# Patient Record
Sex: Female | Born: 1940 | Race: Black or African American | Smoking: Never smoker
Health system: Southern US, Community
[De-identification: ages and names within clinical notes are randomized; demographics above are authoritative.]

## PROBLEM LIST (undated history)

## (undated) DIAGNOSIS — J45909 Unspecified asthma, uncomplicated: Secondary | ICD-10-CM

## (undated) DIAGNOSIS — L409 Psoriasis, unspecified: Secondary | ICD-10-CM

## (undated) DIAGNOSIS — I10 Essential (primary) hypertension: Secondary | ICD-10-CM

## (undated) DIAGNOSIS — F419 Anxiety disorder, unspecified: Secondary | ICD-10-CM

## (undated) DIAGNOSIS — F200 Paranoid schizophrenia: Secondary | ICD-10-CM

## (undated) HISTORY — PX: HERNIA REPAIR: SHX51

## (undated) HISTORY — PX: ABDOMINAL HYSTERECTOMY: SHX81

---

## 2012-07-01 ENCOUNTER — Emergency Department (HOSPITAL_COMMUNITY): Payer: Medicare Other

## 2012-07-01 ENCOUNTER — Emergency Department (HOSPITAL_COMMUNITY)
Admission: EM | Admit: 2012-07-01 | Discharge: 2012-07-01 | Disposition: A | Discharge status: Home / Self Care | Payer: Medicare Other | Attending: Emergency Medicine | Admitting: Emergency Medicine

## 2012-07-01 ENCOUNTER — Encounter (HOSPITAL_COMMUNITY): Payer: Self-pay | Admitting: *Deleted

## 2012-07-01 DIAGNOSIS — H6690 Otitis media, unspecified, unspecified ear: Secondary | ICD-10-CM | POA: Insufficient documentation

## 2012-07-01 DIAGNOSIS — M25559 Pain in unspecified hip: Secondary | ICD-10-CM

## 2012-07-01 MED ORDER — ANTIPYRINE-BENZOCAINE 5.4-1.4 % OT SOLN
3.0000 [drp] | Freq: Once | OTIC | Status: AC
  Administered 2012-07-01: 4 [drp] via OTIC
  Filled 2012-07-01: qty 10

## 2012-07-01 MED ORDER — AZITHROMYCIN 250 MG PO TABS: 250.0000 mg | ORAL_TABLET | Freq: Every day | ORAL | Status: AC

## 2012-07-01 MED ORDER — TRAMADOL HCL 50 MG PO TABS: 50.0000 mg | ORAL_TABLET | Freq: Four times a day (QID) | ORAL | Status: AC | PRN

## 2012-07-01 MED ORDER — AZITHROMYCIN 250 MG PO TABS
500.0000 mg | ORAL_TABLET | Freq: Once | ORAL | Status: AC
  Administered 2012-07-01: 500 mg via ORAL
  Filled 2012-07-01: qty 2

## 2012-07-01 NOTE — ED Notes (Signed)
Family member escorted back to patient's room; one visitor at this time.

## 2012-07-01 NOTE — ED Provider Notes (Signed)
History     CSN: [id number]  Arrival date & time 07/01/12  1704   First MD Initiated Contact with Patient 07/01/12 1957      Chief Complaint  Patient presents with  . Hip Pain  . Back Pain  . Otalgia    (Consider location/radiation/quality/duration/timing/severity/associated sxs/prior treatment) HPI Comments: Ms. Tenny Craw is a morbidly obese 71 year old African American female who was seen at [facility] on August 15 for complaints of low back pain and left hip pain she was diagnosed with arthralgias after being x-rayed given a prescription for 500 mg of Naprosyn is not helping her she's not happy with that answer she knows that there's something more serious well she does not have a primary care physician. Today she is also stating that she has intermittent sore throat first thing in the morning and right ear pain that has been persistent for the last 3 days she denies any URI symptoms but does report seasonal allergies. She does not have a primary care physician  Patient is a 71 y.o. female presenting with hip pain, back pain, and ear pain. The history is provided by the patient.  Hip Pain This is a new problem. The current episode started more than 1 month ago. The problem occurs constantly. The problem has been unchanged. Associated symptoms include arthralgias and a sore throat. Pertinent negatives include no abdominal pain, chest pain, chills, congestion, fever, joint swelling, myalgias, nausea, numbness, rash or weakness. The symptoms are aggravated by walking. She has tried NSAIDs for the symptoms. The treatment provided no relief.  Back Pain  Pertinent negatives include no chest pain, no fever, no numbness, no abdominal pain and no weakness.  Otalgia Associated symptoms include sore throat. Pertinent negatives include no ear discharge, no hearing loss, no rhinorrhea, no abdominal pain, no diarrhea and no rash.    History reviewed. No pertinent past medical history.  Past  Surgical History  Procedure Date  . Abdominal hysterectomy   . Hernia repair     History reviewed. No pertinent family history.  History  Substance Use Topics  . Smoking status: Never Smoker   . Smokeless tobacco: Not on file  . Alcohol Use: No    OB History    Grav Para Term Preterm Abortions TAB SAB Ect Mult Living                  Review of Systems  Constitutional: Negative for fever and chills.  HENT: Positive for ear pain and sore throat. Negative for hearing loss, congestion, rhinorrhea, sneezing, neck stiffness and ear discharge.   Respiratory: Negative for shortness of breath.   Cardiovascular: Negative for chest pain and leg swelling.  Gastrointestinal: Negative for nausea, abdominal pain and diarrhea.  Musculoskeletal: Positive for back pain and arthralgias. Negative for myalgias and joint swelling.  Skin: Negative for rash and wound.  Neurological: Negative for dizziness, weakness and numbness.    Allergies  Lithium and Penicillins  Home Medications   Current Outpatient Rx  Name Route Sig Dispense Refill  . ACETAMINOPHEN 500 MG PO TABS Oral Take 1,500 mg by mouth every 6 (six) hours as needed. For pain    . DIPHENHYDRAMINE HCL 25 MG PO CAPS Oral Take 100 mg by mouth every 6 (six) hours as needed. For side affects caused by haldol    . HALOPERIDOL 10 MG PO TABS Oral Take 10 mg by mouth daily.    Marland Kitchen NAPROXEN 500 MG PO TABS Oral Take 500 mg by  mouth 2 (two) times daily as needed. For pain    . TRAVOPROST (BAK FREE) 0.004 % OP SOLN Both Eyes Place 1 drop into both eyes at bedtime.    . AZITHROMYCIN 250 MG PO TABS Oral Take 1 tablet (250 mg total) by mouth daily. 4 tablet 0  . TRAMADOL HCL 50 MG PO TABS Oral Take 1 tablet (50 mg total) by mouth every 6 (six) hours as needed for pain. 15 tablet 0    BP 143/69  Pulse 73  Temp 98.3 F (36.8 C) (Oral)  Resp 22  SpO2 96%  Physical Exam  Constitutional: She is oriented to person, place, and time. She appears  well-developed and well-nourished.       Morbidly obese patient observed ambulating she has difficulty changing positions but once upright can't ambulate without assistance of cane or walker  HENT:  Head: Normocephalic.  Right Ear: Hearing and external ear normal. Tympanic membrane is injected and erythematous.  Eyes: Pupils are equal, round, and reactive to light.  Neck: Normal range of motion.  Cardiovascular: Normal rate.   Musculoskeletal: Normal range of motion. She exhibits tenderness. She exhibits no edema.       Back:  Neurological: She is alert and oriented to person, place, and time.    ED Course  Procedures (including critical care time)  Labs Reviewed - No data to display Dg Lumbar Spine Complete  07/01/2012  *RADIOLOGY REPORT*  Clinical Data: Back pain.  LUMBAR SPINE - COMPLETE 4+ VIEW  Comparison: None  Findings: There is a left convex lumbar scoliosis.  Normal alignment on the lateral film.  Disc spaces and vertebral bodies are maintained.  There are facet degenerative changes but no definite pars defects.  SI joint degenerative changes are noted.  IMPRESSION: Normal alignment and no acute bony findings. Scoliosis and facet degenerative changes.   Original Report Authenticated By: P. Loralie Champagne, M.D.    Dg Hip Complete Left  07/01/2012  *RADIOLOGY REPORT*  Clinical Data: Left hip pain.  LEFT HIP - COMPLETE 2+ VIEW  Comparison: None  Findings: There are bilateral hip joint degenerative changes much more significant on the right.  No acute fracture or plain film evidence of avascular necrosis.  Pubic symphysis and SI joints are intact. Mild degenerative changes.  No pelvic fractures.  IMPRESSION:  1.  Bilateral hip joint degenerative changes, right greater than left. 2.  No acute fracture.   Original Report Authenticated By: P. Loralie Champagne, M.D.      1. Otitis media   2. Arthralgia of hip       MDM  Since I do not have access to the x-rays that were performed in  [facility] I will rex-ray her hip and back to verify the diagnosis of arthralgias she also does have a right otitis we'll treat this with an antibiotic and referred to primary care physician's         Arman Filter, NP 07/01/12 2058

## 2012-07-01 NOTE — ED Notes (Signed)
To ED for further eval of left hip pain, lower back pain, and right ear pain. States she was seen at [facility] for same and told she has arthritis. Given script for aleve with relief for 2 hrs at a time. States she feels like something in her hip. Denies injury

## 2012-07-03 NOTE — ED Provider Notes (Signed)
Medical screening examination/treatment/procedure(s) were performed by non-physician practitioner and as supervising physician I was immediately available for consultation/collaboration.   Richardean Canal, MD 07/03/12 [phone]

## 2014-09-11 ENCOUNTER — Encounter (HOSPITAL_BASED_OUTPATIENT_CLINIC_OR_DEPARTMENT_OTHER): Payer: Self-pay | Admitting: *Deleted

## 2014-09-11 ENCOUNTER — Emergency Department (HOSPITAL_BASED_OUTPATIENT_CLINIC_OR_DEPARTMENT_OTHER)
Admission: EM | Admit: 2014-09-11 | Discharge: 2014-09-11 | Disposition: A | Discharge status: Home / Self Care | Payer: Medicare HMO | Attending: Emergency Medicine | Admitting: Emergency Medicine

## 2014-09-11 DIAGNOSIS — R000 Tachycardia, unspecified: Secondary | ICD-10-CM | POA: Insufficient documentation

## 2014-09-11 DIAGNOSIS — B379 Candidiasis, unspecified: Secondary | ICD-10-CM

## 2014-09-11 DIAGNOSIS — E669 Obesity, unspecified: Secondary | ICD-10-CM | POA: Insufficient documentation

## 2014-09-11 DIAGNOSIS — Z90710 Acquired absence of both cervix and uterus: Secondary | ICD-10-CM | POA: Insufficient documentation

## 2014-09-11 DIAGNOSIS — F200 Paranoid schizophrenia: Secondary | ICD-10-CM | POA: Insufficient documentation

## 2014-09-11 DIAGNOSIS — L293 Anogenital pruritus, unspecified: Secondary | ICD-10-CM | POA: Diagnosis present

## 2014-09-11 DIAGNOSIS — B373 Candidiasis of vulva and vagina: Secondary | ICD-10-CM | POA: Diagnosis not present

## 2014-09-11 DIAGNOSIS — Z9889 Other specified postprocedural states: Secondary | ICD-10-CM | POA: Diagnosis not present

## 2014-09-11 DIAGNOSIS — Z880 Allergy status to penicillin: Secondary | ICD-10-CM | POA: Insufficient documentation

## 2014-09-11 DIAGNOSIS — Z79899 Other long term (current) drug therapy: Secondary | ICD-10-CM | POA: Insufficient documentation

## 2014-09-11 DIAGNOSIS — F419 Anxiety disorder, unspecified: Secondary | ICD-10-CM | POA: Diagnosis not present

## 2014-09-11 HISTORY — DX: Paranoid schizophrenia: F20.0

## 2014-09-11 HISTORY — DX: Anxiety disorder, unspecified: F41.9

## 2014-09-11 HISTORY — DX: Psoriasis, unspecified: L40.9

## 2014-09-11 LAB — URINALYSIS, ROUTINE W REFLEX MICROSCOPIC
Bilirubin Urine: NEGATIVE
GLUCOSE, UA: NEGATIVE mg/dL
HGB URINE DIPSTICK: NEGATIVE
KETONES UR: NEGATIVE mg/dL
Nitrite: NEGATIVE
PROTEIN: 30 mg/dL — AB
Specific Gravity, Urine: 1.011 (ref 1.005–1.030)
UROBILINOGEN UA: 0.2 mg/dL (ref 0.0–1.0)
pH: 5 (ref 5.0–8.0)

## 2014-09-11 LAB — URINE MICROSCOPIC-ADD ON

## 2014-09-11 LAB — WET PREP, GENITAL
Trich, Wet Prep: NONE SEEN
YEAST WET PREP: NONE SEEN

## 2014-09-11 MED ORDER — NYSTATIN-TRIAMCINOLONE 100000-0.1 UNIT/GM-% EX CREA: TOPICAL_CREAM | CUTANEOUS | Status: DC

## 2014-09-11 MED ORDER — FLUCONAZOLE 50 MG PO TABS
150.0000 mg | ORAL_TABLET | Freq: Once | ORAL | Status: AC
  Administered 2014-09-11: 150 mg via ORAL
  Filled 2014-09-11 (×2): qty 1

## 2014-09-11 MED ORDER — FLUCONAZOLE 150 MG PO TABS: 150.0000 mg | ORAL_TABLET | Freq: Every day | ORAL | Status: DC

## 2014-09-11 NOTE — ED Notes (Signed)
MD at bedside. 

## 2014-09-11 NOTE — ED Notes (Signed)
Vaginal itching and green discharge.

## 2014-09-11 NOTE — Discharge Instructions (Signed)
Take the medication and use the cream as directed.  Follow up with your doctor. Return here as needed.

## 2014-09-11 NOTE — ED Provider Notes (Signed)
CSN: [id number]     Arrival date & time 09/11/14  1657 History   First MD Initiated Contact with Patient 09/11/14 1900     Chief Complaint  Patient presents with  . Vaginal Itching     (Consider location/radiation/quality/duration/timing/severity/associated sxs/prior Treatment) Patient is a 73 y.o. female presenting with vaginal itching.  Vaginal Itching This is a new problem. The current episode started 1 to 4 weeks ago. The problem occurs constantly. The problem has been gradually worsening.   Suzanne Reyes is a 73 y.o. female who presents to the ED with her daughter. The patient states she has had vaginal discharge x 2 weeks. She describes the discharge as thick green. She complains of vaginal itching and burning and itching and burning to the inner aspect of both thighs.  She has used OTC yeast cream without results. The patient's daughter is afraid the patient has diabetes. Patient has had a hysterectomy.   Past Medical History  Diagnosis Date  . Paranoid schizophrenia   . Psoriasis   . Anxiety    Past Surgical History  Procedure Laterality Date  . Abdominal hysterectomy    . Hernia repair     No family history on file. History  Substance Use Topics  . Smoking status: Never Smoker   . Smokeless tobacco: Not on file  . Alcohol Use: No   OB History    No data available     Review of Systems Negative except as stated in HPI   Allergies  Lithium and Penicillins  Home Medications   Prior to Admission medications   Medication Sig Start Date End Date Taking? Authorizing Provider  acetaminophen (TYLENOL) 500 MG tablet Take 1,500 mg by mouth every 6 (six) hours as needed. For pain    Historical Provider, MD  diphenhydrAMINE (BENADRYL) 25 mg capsule Take 100 mg by mouth every 6 (six) hours as needed. For side affects caused by haldol    Historical Provider, MD  haloperidol (HALDOL) 10 MG tablet Take 10 mg by mouth daily.    Historical Provider, MD  naproxen (NAPROSYN)  500 MG tablet Take 500 mg by mouth 2 (two) times daily as needed. For pain    Historical Provider, MD  Travoprost, BAK Free, (TRAVATAN) 0.004 % SOLN ophthalmic solution Place 1 drop into both eyes at bedtime.    Historical Provider, MD   BP 122/56 mmHg  Pulse 94  Temp(Src) 98 F (36.7 C) (Oral)  Resp 18  Ht 5\' 3"  (1.6 m)  Wt 238 lb (107.956 kg)  BMI 42.17 kg/m2  SpO2 96% Physical Exam  Constitutional: She is oriented to person, place, and time. No distress.  obese  HENT:  Head: Normocephalic.  Eyes: EOM are normal.  Neck: Normal range of motion. Neck supple.  Cardiovascular: Tachycardia present.   Pulmonary/Chest: Effort normal.  Abdominal: Soft. There is no tenderness.  Genitourinary:  There is redness and peeling of the skin of the inner aspect of both thighs. There is erythema of the vagina with thick white discharge in the vaginal vault. Cervix and uterus absent.   Musculoskeletal: Normal range of motion.  Neurological: She is alert and oriented to person, place, and time. No cranial nerve deficit.  Skin: Skin is warm and dry.  Psychiatric: She has a normal mood and affect. Her behavior is normal.  Nursing note and vitals reviewed.   ED Course  Procedures  Results for orders placed or performed during the hospital encounter of 09/11/14 (from the past 24 hour(s))  Wet prep, genital     Status: Abnormal   Collection Time: 09/11/14  7:24 PM  Result Value Ref Range   Yeast Wet Prep HPF POC NONE SEEN NONE SEEN   Trich, Wet Prep NONE SEEN NONE SEEN   Clue Cells Wet Prep HPF POC FEW (A) NONE SEEN   WBC, Wet Prep HPF POC FEW (A) NONE SEEN  Urinalysis, Routine w reflex microscopic     Status: Abnormal   Collection Time: 09/11/14  8:46 PM  Result Value Ref Range   Color, Urine YELLOW YELLOW   APPearance CLOUDY (A) CLEAR   Specific Gravity, Urine 1.011 1.005 - 1.030   pH 5.0 5.0 - 8.0   Glucose, UA NEGATIVE NEGATIVE mg/dL   Hgb urine dipstick NEGATIVE NEGATIVE   Bilirubin  Urine NEGATIVE NEGATIVE   Ketones, ur NEGATIVE NEGATIVE mg/dL   Protein, ur 30 (A) NEGATIVE mg/dL   Urobilinogen, UA 0.2 0.0 - 1.0 mg/dL   Nitrite NEGATIVE NEGATIVE   Leukocytes, UA SMALL (A) NEGATIVE  Urine microscopic-add on     Status: Abnormal   Collection Time: 09/11/14  8:46 PM  Result Value Ref Range   Squamous Epithelial / LPF FEW (A) RARE   WBC, UA 3-6 <3 WBC/hpf   Bacteria, UA FEW (A) RARE   Casts HYALINE CASTS (A) NEGATIVE    MDM  73 y.o. female with vaginal itching and discharge. Will treat for monilia. Diflucan 150 mg PO given prior to d/c. Discussed with the patient and her family member clinical and lab findings and all questioned fully answered. She will follow up with her PCP or return here as needed. Stable for discharge without fever, UTI symptoms or abdominal pain.    Medication List    TAKE these medications        fluconazole 150 MG tablet  Commonly known as:  DIFLUCAN  Take 1 tablet (150 mg total) by mouth daily.     nystatin-triamcinolone cream  Commonly known as:  MYCOLOG II  Apply to affected area daily      ASK your doctor about these medications        acetaminophen 500 MG tablet  Commonly known as:  TYLENOL  Take 1,500 mg by mouth every 6 (six) hours as needed. For pain     diphenhydrAMINE 25 mg capsule  Commonly known as:  BENADRYL  Take 100 mg by mouth every 6 (six) hours as needed. For side affects caused by haldol     haloperidol 10 MG tablet  Commonly known as:  HALDOL  Take 10 mg by mouth daily.     naproxen 500 MG tablet  Commonly known as:  NAPROSYN  Take 500 mg by mouth 2 (two) times daily as needed. For pain     Travoprost (BAK Free) 0.004 % Soln ophthalmic solution  Commonly known as:  TRAVATAN  Place 1 drop into both eyes at bedtime.          GoldonnaHope M Neese, NP 09/12/14 2141  Richardean Canalavid H Yao, MD 09/12/14 2325

## 2014-09-27 ENCOUNTER — Encounter (HOSPITAL_BASED_OUTPATIENT_CLINIC_OR_DEPARTMENT_OTHER): Payer: Self-pay | Admitting: *Deleted

## 2014-09-27 ENCOUNTER — Emergency Department (HOSPITAL_BASED_OUTPATIENT_CLINIC_OR_DEPARTMENT_OTHER)
Admission: EM | Admit: 2014-09-27 | Discharge: 2014-09-27 | Disposition: A | Discharge status: Home / Self Care | Payer: Medicare HMO | Attending: Emergency Medicine | Admitting: Emergency Medicine

## 2014-09-27 DIAGNOSIS — F200 Paranoid schizophrenia: Secondary | ICD-10-CM | POA: Insufficient documentation

## 2014-09-27 DIAGNOSIS — Z9889 Other specified postprocedural states: Secondary | ICD-10-CM | POA: Insufficient documentation

## 2014-09-27 DIAGNOSIS — Z90710 Acquired absence of both cervix and uterus: Secondary | ICD-10-CM | POA: Insufficient documentation

## 2014-09-27 DIAGNOSIS — N760 Acute vaginitis: Secondary | ICD-10-CM | POA: Diagnosis not present

## 2014-09-27 DIAGNOSIS — Z8619 Personal history of other infectious and parasitic diseases: Secondary | ICD-10-CM | POA: Diagnosis not present

## 2014-09-27 DIAGNOSIS — Z872 Personal history of diseases of the skin and subcutaneous tissue: Secondary | ICD-10-CM | POA: Diagnosis not present

## 2014-09-27 DIAGNOSIS — Z79899 Other long term (current) drug therapy: Secondary | ICD-10-CM | POA: Insufficient documentation

## 2014-09-27 DIAGNOSIS — F419 Anxiety disorder, unspecified: Secondary | ICD-10-CM | POA: Insufficient documentation

## 2014-09-27 DIAGNOSIS — N898 Other specified noninflammatory disorders of vagina: Secondary | ICD-10-CM | POA: Diagnosis present

## 2014-09-27 DIAGNOSIS — B9689 Other specified bacterial agents as the cause of diseases classified elsewhere: Secondary | ICD-10-CM

## 2014-09-27 DIAGNOSIS — Z880 Allergy status to penicillin: Secondary | ICD-10-CM | POA: Diagnosis not present

## 2014-09-27 LAB — URINALYSIS, ROUTINE W REFLEX MICROSCOPIC
BILIRUBIN URINE: NEGATIVE
Glucose, UA: NEGATIVE mg/dL
KETONES UR: NEGATIVE mg/dL
NITRITE: NEGATIVE
PROTEIN: 100 mg/dL — AB
Specific Gravity, Urine: 1.013 (ref 1.005–1.030)
UROBILINOGEN UA: 0.2 mg/dL (ref 0.0–1.0)
pH: 5 (ref 5.0–8.0)

## 2014-09-27 LAB — WET PREP, GENITAL
Trich, Wet Prep: NONE SEEN
Yeast Wet Prep HPF POC: NONE SEEN

## 2014-09-27 LAB — URINE MICROSCOPIC-ADD ON

## 2014-09-27 LAB — CBG MONITORING, ED: GLUCOSE-CAPILLARY: 104 mg/dL — AB (ref 70–99)

## 2014-09-27 MED ORDER — METRONIDAZOLE 500 MG PO TABS: 500.0000 mg | ORAL_TABLET | Freq: Two times a day (BID) | ORAL | Status: DC

## 2014-09-27 NOTE — ED Notes (Signed)
Reports itching and vaginal d/c x 1 week

## 2014-09-27 NOTE — ED Provider Notes (Signed)
CSN: [id number]     Arrival date & time 09/27/14  1942 History   This chart was scribed for Toy BakerAnthony T Terrina Docter, MD by Modena JanskyAlbert Thayil, ED Scribe. This patient was seen in room MH06/MH06 and the patient's care was started at 8:34 PM.  Chief Complaint  Patient presents with  . Vaginal Discharge   The history is provided by the patient. No language interpreter was used.    HPI Comments: Suzanne Reyes is a 73 y.o. female who presents to the Emergency Department complaining of vaginal discharge that started a week ago. She reports that she has been having a yeast infection. She states that she has associated vaginal itching and mild abdominal pain. She states that she was given pills and cream for treatment. She reports that the medication helped at first, but then her infection got worse later on. She denies any fever, emesis, or dysuria.   Past Medical History  Diagnosis Date  . Paranoid schizophrenia   . Psoriasis   . Anxiety    Past Surgical History  Procedure Laterality Date  . Abdominal hysterectomy    . Hernia repair     No family history on file. History  Substance Use Topics  . Smoking status: Never Smoker   . Smokeless tobacco: Not on file  . Alcohol Use: No   OB History    No data available     Review of Systems  Gastrointestinal: Positive for abdominal pain.  Genitourinary: Positive for vaginal discharge. Negative for dysuria.  Psychiatric/Behavioral: Negative for agitation.  All other systems reviewed and are negative.   Allergies  Lithium and Penicillins  Home Medications   Prior to Admission medications   Medication Sig Start Date End Date Taking? Authorizing Provider  acetaminophen (TYLENOL) 500 MG tablet Take 1,500 mg by mouth every 6 (six) hours as needed. For pain   Yes Historical Provider, MD  diphenhydrAMINE (BENADRYL) 25 mg capsule Take 100 mg by mouth every 6 (six) hours as needed. For side affects caused by haldol   Yes Historical Provider, MD   haloperidol (HALDOL) 10 MG tablet Take 10 mg by mouth daily.   Yes Historical Provider, MD  fluconazole (DIFLUCAN) 150 MG tablet Take 1 tablet (150 mg total) by mouth daily. 09/11/14   Hope Orlene OchM Neese, NP  naproxen (NAPROSYN) 500 MG tablet Take 500 mg by mouth 2 (two) times daily as needed. For pain    Historical Provider, MD  nystatin-triamcinolone (MYCOLOG II) cream Apply to affected area daily 09/11/14   [facility] Neese, NP  Travoprost, BAK Free, (TRAVATAN) 0.004 % SOLN ophthalmic solution Place 1 drop into both eyes at bedtime.    Historical Provider, MD   BP 147/59 mmHg  Pulse 82  Temp(Src) 98.2 F (36.8 C) (Oral)  Resp 20  Ht 5\' 3"  (1.6 m)  Wt 238 lb (107.956 kg)  BMI 42.17 kg/m2  SpO2 96% Physical Exam  Constitutional: She is oriented to person, place, and time. She appears well-developed and well-nourished.  Non-toxic appearance. No distress.  HENT:  Head: Normocephalic and atraumatic.  Eyes: Conjunctivae, EOM and lids are normal. Pupils are equal, round, and reactive to light.  Neck: Normal range of motion. Neck supple. No tracheal deviation present. No thyroid mass present.  Cardiovascular: Normal rate, regular rhythm and normal heart sounds.  Exam reveals no gallop.   No murmur heard. Pulmonary/Chest: Effort normal and breath sounds normal. No stridor. No respiratory distress. She has no decreased breath sounds. She has no wheezes.  She has no rhonchi. She has no rales.  Abdominal: Soft. Normal appearance and bowel sounds are normal. She exhibits no distension. There is no tenderness. There is no rebound and no CVA tenderness.  Musculoskeletal: Normal range of motion. She exhibits no edema or tenderness.  Neurological: She is alert and oriented to person, place, and time. She has normal strength. No cranial nerve deficit or sensory deficit. GCS eye subscore is 4. GCS verbal subscore is 5. GCS motor subscore is 6.  Skin: Skin is warm and dry. No abrasion and no rash noted.   Psychiatric: She has a normal mood and affect. Her speech is normal and behavior is normal.  Nursing note and vitals reviewed.   ED Course  Procedures (including critical care time) DIAGNOSTIC STUDIES: Oxygen Saturation is 96% on RA, normal by my interpretation.    COORDINATION OF CARE: 8:38 PM- Pt advised of plan for treatment which includes labs and pt agrees.  Labs Review Labs Reviewed  WET PREP, GENITAL - Abnormal; Notable for the following:    Clue Cells Wet Prep HPF POC FEW (*)    WBC, Wet Prep HPF POC MODERATE (*)    All other components within normal limits  CBG MONITORING, ED - Abnormal; Notable for the following:    Glucose-Capillary 104 (*)    All other components within normal limits  GC/CHLAMYDIA PROBE AMP  URINALYSIS, ROUTINE W REFLEX MICROSCOPIC    Imaging Review No results found.   EKG Interpretation None      MDM   Final diagnoses:  None    I personally performed the services described in this documentation, which was scribed in my presence. The recorded information has been reviewed and is accurate.   10:28 PM Patient to be treated for bacterial vaginosis  Toy BakerAnthony T Jamison Soward, MD 09/27/14 2228

## 2014-09-27 NOTE — Discharge Instructions (Signed)
Follow-up with your Dr. next week °

## 2014-09-30 LAB — GC/CHLAMYDIA PROBE AMP
CT Probe RNA: NEGATIVE
GC PROBE AMP APTIMA: NEGATIVE

## 2015-01-23 ENCOUNTER — Emergency Department (HOSPITAL_BASED_OUTPATIENT_CLINIC_OR_DEPARTMENT_OTHER)
Admission: EM | Admit: 2015-01-23 | Discharge: 2015-01-23 | Disposition: A | Discharge status: Home / Self Care | Payer: Medicare HMO | Attending: Emergency Medicine | Admitting: Emergency Medicine

## 2015-01-23 ENCOUNTER — Encounter (HOSPITAL_BASED_OUTPATIENT_CLINIC_OR_DEPARTMENT_OTHER): Payer: Self-pay

## 2015-01-23 DIAGNOSIS — Z8659 Personal history of other mental and behavioral disorders: Secondary | ICD-10-CM | POA: Insufficient documentation

## 2015-01-23 DIAGNOSIS — Z79899 Other long term (current) drug therapy: Secondary | ICD-10-CM | POA: Diagnosis not present

## 2015-01-23 DIAGNOSIS — L409 Psoriasis, unspecified: Secondary | ICD-10-CM | POA: Diagnosis not present

## 2015-01-23 DIAGNOSIS — Z792 Long term (current) use of antibiotics: Secondary | ICD-10-CM | POA: Insufficient documentation

## 2015-01-23 DIAGNOSIS — B3731 Acute candidiasis of vulva and vagina: Secondary | ICD-10-CM

## 2015-01-23 DIAGNOSIS — R21 Rash and other nonspecific skin eruption: Secondary | ICD-10-CM | POA: Diagnosis present

## 2015-01-23 DIAGNOSIS — I10 Essential (primary) hypertension: Secondary | ICD-10-CM | POA: Insufficient documentation

## 2015-01-23 DIAGNOSIS — Z880 Allergy status to penicillin: Secondary | ICD-10-CM | POA: Diagnosis not present

## 2015-01-23 DIAGNOSIS — J45909 Unspecified asthma, uncomplicated: Secondary | ICD-10-CM | POA: Insufficient documentation

## 2015-01-23 DIAGNOSIS — B373 Candidiasis of vulva and vagina: Secondary | ICD-10-CM | POA: Diagnosis not present

## 2015-01-23 HISTORY — DX: Unspecified asthma, uncomplicated: J45.909

## 2015-01-23 HISTORY — DX: Essential (primary) hypertension: I10

## 2015-01-23 MED ORDER — FLUCONAZOLE 50 MG PO TABS
150.0000 mg | ORAL_TABLET | Freq: Once | ORAL | Status: AC
  Administered 2015-01-23: 150 mg via ORAL
  Filled 2015-01-23 (×2): qty 1

## 2015-01-23 MED ORDER — HYDROCORTISONE 0.5 % EX CREA: 1.0000 "application " | TOPICAL_CREAM | Freq: Two times a day (BID) | CUTANEOUS | Status: DC

## 2015-01-23 NOTE — ED Provider Notes (Signed)
CSN: [id number]     Arrival date & time 01/23/15  1829 History   First MD Initiated Contact with Patient 01/23/15 1928     Chief Complaint  Patient presents with  . Rash     (Consider location/radiation/quality/duration/timing/severity/associated sxs/prior Treatment) HPI 74 year old-year-old female history of psoriasis persists a St. her psoriasis has worsened over the past month. She has more itching. The rash has been about the same. She is also noted some rash in the inner thighs and vaginal area with some yellowish discharge from this area. She is not sexually active. She is not having any pain with urination. She has not been on any recent antibiotics. She is not a diabetic. Past Medical History  Diagnosis Date  . Paranoid schizophrenia   . Psoriasis   . Anxiety   . Hypertension   . Asthma    Past Surgical History  Procedure Laterality Date  . Abdominal hysterectomy    . Hernia repair     History reviewed. No pertinent family history. History  Substance Use Topics  . Smoking status: Never Smoker   . Smokeless tobacco: Not on file  . Alcohol Use: No   OB History    No data available     Review of Systems  All other systems reviewed and are negative.     Allergies  Lithium and Penicillins  Home Medications   Prior to Admission medications   Medication Sig Start Date End Date Taking? Authorizing Provider  acetaminophen (TYLENOL) 500 MG tablet Take 1,500 mg by mouth every 6 (six) hours as needed. For pain   Yes Historical Provider, MD  diphenhydrAMINE (BENADRYL) 25 mg capsule Take 100 mg by mouth every 6 (six) hours as needed. For side affects caused by haldol   Yes Historical Provider, MD  haloperidol (HALDOL) 10 MG tablet Take 20 mg by mouth daily.    Yes Historical Provider, MD  naproxen (NAPROSYN) 500 MG tablet Take 500 mg by mouth 2 (two) times daily as needed. For pain   Yes Historical Provider, MD  fluconazole (DIFLUCAN) 150 MG tablet Take 1 tablet (150 mg  total) by mouth daily. 09/11/14   Hope Orlene Och, NP  metroNIDAZOLE (FLAGYL) 500 MG tablet Take 1 tablet (500 mg total) by mouth 2 (two) times daily. 09/27/14   Lorre Nick, MD  nystatin-triamcinolone [facility] II) cream Apply to affected area daily 09/11/14   [facility], NP  Travoprost, BAK Free, (TRAVATAN) 0.004 % SOLN ophthalmic solution Place 1 drop into both eyes at bedtime.    Historical Provider, MD   BP 147/50 mmHg  Pulse 72  Temp(Src) 97.6 F (36.4 C) (Oral)  Resp 18  Ht  (1.6 m)  Wt 239 lb (108.41 kg)  BMI 42.35 kg/m2  SpO2 95% Physical Exam  Constitutional: She appears well-developed and well-nourished.  HENT:  Head: Normocephalic and atraumatic.  Right Ear: External ear normal.  Nose: Nose normal.  Mouth/Throat: Oropharynx is clear and moist.  Eyes: Conjunctivae are normal. Pupils are equal, round, and reactive to light.  Neck: Normal range of motion.  Cardiovascular: Normal rate and normal heart sounds.   Pulmonary/Chest: Effort normal.  Genitourinary:     Erythematous inflamed area consistent with Candida.  Musculoskeletal: Normal range of motion.  Neurological: She is alert.  Skin: Skin is warm. Rash noted.  Diffuse maculopapular rash on trunk and forearms.  Nursing note and vitals reviewed.   ED Course  Procedures (including critical care time) Labs Review Labs Reviewed -  No data to display  Imaging Review No results found.   EKG Interpretation None      MDM   Final diagnoses:  Candidal vulvovaginitis  Psoriasis        Margarita Grizzleanielle Isaul Landi, MD 01/23/15 2324

## 2015-01-23 NOTE — Discharge Instructions (Signed)
Psoriasis Psoriasis is a long-lasting (chronic) skin problem. It can cause red bumps, a rash, scales that flake off, bleeding, and joint pain (arthritis). Psoriasis often affects the elbows, knees, groin, genitals, arms, legs, scalp, and nails. Psoriasis cannot be passed from person to person (not contagious).  HOME CARE  Only take medicine as told by your doctor.  Keep all doctor visits as told. You may need to try many treatments to find what works for you.  Avoid sunburn, cuts, scrapes, alcohol, smoking, and stress.  Wear gloves when you wash dishes, clean, or go outside in the cold.  Keep the air moist and cool in your home. You can use a humidifier.  Put lotion on right after a bath or shower.  Avoid long, hot baths and showers. Do not use a lot of soap.  Drink enough fluids to keep your pee (urine) clear or pale yellow. GET HELP RIGHT AWAY IF:   You have pain in the affected areas.  You have bleeding that does not stop in the affected areas.  You have more redness or warmth in the affected areas.  You have painful or stiff joints.  You feel depressed.  You have a fever. MAKE SURE YOU:  Understand these instructions.  Will watch your condition.  Will get help right away if you are not doing well or get worse. Document Released: 12/01/2004 Document Revised: 01/16/2012 Document Reviewed: 04/22/2011 [facility] Patient Information 2015 [place], [place]. This information is not intended to replace advice given to you by your health care provider. Make sure you discuss any questions you have with your health care provider.  Candida Infection A Candida infection (also called yeast, fungus, and Monilia infection) is an overgrowth of yeast that can occur anywhere on the body. A yeast infection commonly occurs in warm, moist body areas. Usually, the infection remains localized but can spread to become a systemic infection. A yeast infection may be a sign of a more severe disease  such as diabetes, leukemia, or AIDS. A yeast infection can occur in both men and women. In women, Candida vaginitis is a vaginal infection. It is one of the most common causes of vaginitis. Men usually do not have symptoms or know they have an infection until other problems develop. Men may find out they have a yeast infection because their sex partner has a yeast infection. Uncircumcised men are more likely to get a yeast infection than circumcised men. This is because the uncircumcised glans is not exposed to air and does not remain as dry as that of a circumcised glans. Older adults may develop yeast infections around dentures. CAUSES  Women  Antibiotics.  Steroid medication taken for a long time.  Being overweight (obese).  Diabetes.  Poor immune condition.  Certain serious medical conditions.  Immune suppressive medications for organ transplant patients.  Chemotherapy.  Pregnancy.  Menstruation.  Stress and fatigue.  Intravenous drug use.  Oral contraceptives.  Wearing tight-fitting clothes in the crotch area.  Catching it from a sex partner who has a yeast infection.  Spermicide.  Intravenous, urinary, or other catheters. Men  Catching it from a sex partner who has a yeast infection.  Having oral or anal sex with a person who has the infection.  Spermicide.  Diabetes.  Antibiotics.  Poor immune system.  Medications that suppress the immune system.  Intravenous drug use.  Intravenous, urinary, or other catheters. SYMPTOMS  Women  Thick, white vaginal discharge.  Vaginal itching.  Redness and swelling in  and around the vagina.  Irritation of the lips of the vagina and perineum.  Blisters on the vaginal lips and perineum.  Painful sexual intercourse.  Low blood sugar (hypoglycemia).  Painful urination.  Bladder infections.  Intestinal problems such as constipation, indigestion, bad breath, bloating, increase in gas, diarrhea, or loose  stools. Men  Men may develop intestinal problems such as constipation, indigestion, bad breath, bloating, increase in gas, diarrhea, or loose stools.  Dry, cracked skin on the penis with itching or discomfort.  Jock itch.  Dry, flaky skin.  Athlete's foot.  Hypoglycemia. DIAGNOSIS  Women  A history and an exam are performed.  The discharge may be examined under a microscope.  A culture may be taken of the discharge. Men  A history and an exam are performed.  Any discharge from the penis or areas of cracked skin will be looked at under the microscope and cultured.  Stool samples may be cultured. TREATMENT  Women  Vaginal antifungal suppositories and creams.  Medicated creams to decrease irritation and itching on the outside of the vagina.  Warm compresses to the perineal area to decrease swelling and discomfort.  Oral antifungal medications.  Medicated vaginal suppositories or cream for repeated or recurrent infections.  Wash and dry the irritation areas before applying the cream.  Eating yogurt with Lactobacillus may help with prevention and treatment.  Sometimes painting the vagina with gentian violet solution may help if creams and suppositories do not work. Men  Antifungal creams and oral antifungal medications.  Sometimes treatment must continue for 30 days after the symptoms go away to prevent recurrence. HOME CARE INSTRUCTIONS  Women  Use cotton underwear and avoid tight-fitting clothing.  Avoid colored, scented toilet paper and deodorant tampons or pads.  Do not douche.  Keep your diabetes under control.  Finish all the prescribed medications.  Keep your skin clean and dry.  Consume milk or yogurt with Lactobacillus-active culture regularly. If you get frequent yeast infections and think that is what the infection is, there are over-the-counter medications that you can get. If the infection does not show healing in 3 days, talk to your  caregiver.  Tell your sex partner you have a yeast infection. Your partner may need treatment also, especially if your infection does not clear up or recurs. Men  Keep your skin clean and dry.  Keep your diabetes under control.  Finish all prescribed medications.  Tell your sex partner that you have a yeast infection so he or she can be treated if necessary. SEEK MEDICAL CARE IF:   Your symptoms do not clear up or worsen in one week after treatment.  You have an oral temperature above 102 F (38.9 C).  You have trouble swallowing or eating for a prolonged time.  You develop blisters on and around your vagina.  You develop vaginal bleeding and it is not your menstrual period.  You develop abdominal pain.  You develop intestinal problems as mentioned above.  You get weak or light-headed.  You have painful or increased urination.  You have pain during sexual intercourse. MAKE SURE YOU:   Understand these instructions.  Will watch your condition.  Will get help right away if you are not doing well or get worse. Document Released: 12/01/2004 Document Revised: 03/10/2014 Document Reviewed: 03/15/2010 [facility] Patient Information 2015 [place], [place]. This information is not intended to replace advice given to you by your health care provider. Make sure you discuss any questions you have with your health  care provider.  

## 2015-01-23 NOTE — ED Notes (Signed)
Pt reports generalized body rash that itches, states history of psoriasis - pt also reports she has a feminine rash x2 days - states she has used douches, vaginal creams that "burn."

## 2015-02-10 ENCOUNTER — Encounter: Payer: Self-pay | Admitting: Family

## 2015-02-10 ENCOUNTER — Ambulatory Visit (INDEPENDENT_AMBULATORY_CARE_PROVIDER_SITE_OTHER): Payer: Medicare HMO | Admitting: Family

## 2015-02-10 VITALS — BP 118/68 | HR 86 | Temp 97.8°F | Resp 16 | Ht 61.5 in | Wt 234.0 lb

## 2015-02-10 DIAGNOSIS — F200 Paranoid schizophrenia: Secondary | ICD-10-CM | POA: Insufficient documentation

## 2015-02-10 DIAGNOSIS — F411 Generalized anxiety disorder: Secondary | ICD-10-CM

## 2015-02-10 DIAGNOSIS — J4520 Mild intermittent asthma, uncomplicated: Secondary | ICD-10-CM | POA: Diagnosis not present

## 2015-02-10 DIAGNOSIS — L409 Psoriasis, unspecified: Secondary | ICD-10-CM | POA: Diagnosis not present

## 2015-02-10 DIAGNOSIS — I10 Essential (primary) hypertension: Secondary | ICD-10-CM | POA: Diagnosis not present

## 2015-02-10 DIAGNOSIS — J45909 Unspecified asthma, uncomplicated: Secondary | ICD-10-CM | POA: Insufficient documentation

## 2015-02-10 MED ORDER — BETAMETHASONE DIPROPIONATE 0.05 % EX CREA: TOPICAL_CREAM | Freq: Two times a day (BID) | CUTANEOUS | Status: DC

## 2015-02-10 NOTE — Assessment & Plan Note (Signed)
Stable on haldol, [facility]

## 2015-02-10 NOTE — Assessment & Plan Note (Signed)
Stable, no recent symptoms.  

## 2015-02-10 NOTE — Progress Notes (Signed)
Subjective:    Patient ID: Suzanne Reyes, female    DOB: 10/09/1941, 74 y.o.   MRN: [id number]  HPI  Ms.  Mckissack is a 74 yr old female who presents today to establish care.   Hx Paranoid Schizophrenia- She sees Psychiatry-  At [facility].  She reports well controlled without hallucinations on the haldol.   Psoriasis-  Sees Dr. Katrinka [facility]- [facility]. No improvement with hydrocortisone    Anxiety- reports well controlled.    HTN- Not currently on meds.  BP Readings from Last 3 Encounters:  02/10/15 118/68  01/23/15 147/89  09/27/14 138/67   Asthma- denies any recent symptoms, never needs to use an inhaler.    Review of Systems  Constitutional:       She reports that she has lost about 25 pounds in the last 2 years   HENT: Negative for hearing loss and rhinorrhea.   Eyes: Negative for visual disturbance.  Respiratory: Negative for cough.   Cardiovascular: Negative for leg swelling.  Gastrointestinal: Negative for nausea, diarrhea and constipation.  Genitourinary: Negative for dysuria.       Frequency due to water intake, haldol makes her thirsty  Musculoskeletal: Negative for myalgias.       Reports bilateral hip pain  Skin: Positive for rash.  Neurological:       Occasional headaches  Hematological: Negative for adenopathy.  Psychiatric/Behavioral: Negative for dysphoric mood and agitation.   Past Medical History  Diagnosis Date  . Paranoid schizophrenia   . Psoriasis   . Anxiety   . Hypertension   . Asthma     History   Social History  . Marital Status: Widowed    Spouse Name: N/A  . Number of Children: N/A  . Years of Education: N/A   Occupational History  . Not on file.   Social History Main Topics  . Smoking status: Never Smoker   . Smokeless tobacco: Not on file  . Alcohol Use: No  . Drug Use: No  . Sexual Activity: Not on file   Other Topics Concern  . Not on file   Social History Narrative   Lives with grandson who is 67 years  old   Moved from [place] 40 yrs ago   Retired Agricultural engineer   Had 7 children   Enjoys cooking    Past Surgical History  Procedure Laterality Date  . Abdominal hysterectomy    . Hernia repair      Family History  Problem Relation Age of Onset  . Kidney disease Mother   . Asthma Father   . Heart disease Father     died at age 49 "bad heart"  . Cancer Sister     lung,smoker  . Diabetes Daughter   . Cancer Son     lung, died in his 29's    Allergies  Allergen Reactions  . Lithium Other (See Comments)    Stomach cramps   . Penicillins Other (See Comments)    Tooth pain    Current Outpatient Prescriptions on File Prior to Visit  Medication Sig Dispense Refill  . acetaminophen (TYLENOL) 500 MG tablet Take 1,500 mg by mouth every 6 (six) hours as needed. For pain    . diphenhydrAMINE (BENADRYL) 25 mg capsule Take 100 mg by mouth every 6 (six) hours as needed. For side affects caused by haldol    . haloperidol (HALDOL) 10 MG tablet Take 20 mg by mouth daily.     . naproxen (NAPROSYN)  500 MG tablet Take 500 mg by mouth 2 (two) times daily as needed. For pain    . Travoprost, BAK Free, (TRAVATAN) 0.004 % SOLN ophthalmic solution Place 1 drop into both eyes at bedtime.     No current facility-administered medications on file prior to visit.    BP 118/68 mmHg  Pulse 86  Temp(Src) 97.8 F (36.6 C) (Oral)  Resp 16  Ht 5' 1.5" (1.562 m)  Wt 234 lb (106.142 kg)  BMI 43.50 kg/m2  SpO2 98%        Objective:   Physical Exam  Constitutional: She is oriented to person, place, and time. She appears well-developed and well-nourished.  HENT:  Head: Normocephalic and atraumatic.  Right Ear: Tympanic membrane and ear canal normal.  Left Ear: Tympanic membrane and ear canal normal.  Mouth/Throat: No oropharyngeal exudate, posterior oropharyngeal edema or posterior oropharyngeal erythema.  Cardiovascular: Normal rate, regular rhythm and normal heart sounds.   No murmur  heard. Pulmonary/Chest: Effort normal and breath sounds normal. No respiratory distress. She has no wheezes.  Musculoskeletal: She exhibits no edema.  Neurological: She is alert and oriented to person, place, and time.  Skin: Skin is warm and dry.  Hypopigmented diffuse annular patched noted/face arms/legs  Psychiatric: She has a normal mood and affect. Her behavior is normal. Judgment and thought content normal.          Assessment & Plan:

## 2015-02-10 NOTE — Progress Notes (Signed)
Pre visit review using our clinic review tool, if applicable. No additional management support is needed unless otherwise documented below in the visit note. 

## 2015-02-10 NOTE — Patient Instructions (Signed)
Please follow up in 1 month for a medicare wellness visit.

## 2015-02-10 NOTE — Assessment & Plan Note (Signed)
Stable.  Management per psych.

## 2015-02-10 NOTE — Assessment & Plan Note (Signed)
Stable off of meds.  

## 2015-02-12 DIAGNOSIS — L409 Psoriasis, unspecified: Secondary | ICD-10-CM | POA: Insufficient documentation

## 2015-02-12 NOTE — Addendum Note (Signed)
Addended by: Sandford Craze'SULLIVAN, Amalea Ottey on: 02/12/2015 09:43 PM   Modules accepted: Level of Service

## 2015-02-12 NOTE — Assessment & Plan Note (Signed)
Advised pt to arrange follow up with her dermatologist.  In the meantime, will proved rx for betamethasone.

## 2015-03-16 ENCOUNTER — Telehealth: Payer: Self-pay | Admitting: *Deleted

## 2015-03-16 NOTE — Telephone Encounter (Signed)
Unable to reach patient at time of Pre-Visit Call.  Voicemail box is not accepting messages- states to leave # in SMS, SMS left for patient with number.

## 2015-03-17 ENCOUNTER — Ambulatory Visit (INDEPENDENT_AMBULATORY_CARE_PROVIDER_SITE_OTHER): Payer: Medicare HMO | Admitting: Family

## 2015-03-17 ENCOUNTER — Encounter: Payer: Self-pay | Admitting: Family

## 2015-03-17 VITALS — BP 120/76 | HR 83 | Temp 97.8°F | Resp 16 | Ht 61.5 in | Wt 234.8 lb

## 2015-03-17 DIAGNOSIS — E2839 Other primary ovarian failure: Secondary | ICD-10-CM

## 2015-03-17 DIAGNOSIS — Z0000 Encounter for general adult medical examination without abnormal findings: Secondary | ICD-10-CM

## 2015-03-17 DIAGNOSIS — L304 Erythema intertrigo: Secondary | ICD-10-CM | POA: Insufficient documentation

## 2015-03-17 DIAGNOSIS — Z1239 Encounter for other screening for malignant neoplasm of breast: Secondary | ICD-10-CM

## 2015-03-17 DIAGNOSIS — L409 Psoriasis, unspecified: Secondary | ICD-10-CM

## 2015-03-17 DIAGNOSIS — F411 Generalized anxiety disorder: Secondary | ICD-10-CM

## 2015-03-17 DIAGNOSIS — Z23 Encounter for immunization: Secondary | ICD-10-CM | POA: Diagnosis not present

## 2015-03-17 DIAGNOSIS — Z1231 Encounter for screening mammogram for malignant neoplasm of breast: Secondary | ICD-10-CM

## 2015-03-17 DIAGNOSIS — J4520 Mild intermittent asthma, uncomplicated: Secondary | ICD-10-CM

## 2015-03-17 DIAGNOSIS — I10 Essential (primary) hypertension: Secondary | ICD-10-CM

## 2015-03-17 DIAGNOSIS — F200 Paranoid schizophrenia: Secondary | ICD-10-CM

## 2015-03-17 LAB — BASIC METABOLIC PANEL
BUN: 22 mg/dL (ref 6–23)
CHLORIDE: 110 meq/L (ref 96–112)
CO2: 23 mEq/L (ref 19–32)
Calcium: 9 mg/dL (ref 8.4–10.5)
Creatinine, Ser: 2.13 mg/dL — ABNORMAL HIGH (ref 0.40–1.20)
GFR: 29.17 mL/min — AB (ref >60.00)
GLUCOSE: 89 mg/dL (ref 70–99)
POTASSIUM: 4.5 meq/L (ref 3.5–5.1)
Sodium: 141 mEq/L (ref 135–145)

## 2015-03-17 LAB — LIPID PANEL
CHOL/HDL RATIO: 4
Cholesterol: 177 mg/dL (ref 0–200)
HDL: 46.6 mg/dL (ref >39.00)
LDL Cholesterol: 97 mg/dL (ref 0–99)
NONHDL: 130.4
Triglycerides: 167 mg/dL — ABNORMAL HIGH (ref 0.0–149.0)
VLDL: 33.4 mg/dL (ref 0.0–40.0)

## 2015-03-17 MED ORDER — NYSTATIN 100000 UNIT/GM EX POWD: CUTANEOUS | Status: DC

## 2015-03-17 NOTE — Progress Notes (Signed)
Pre visit review using our clinic review tool, if applicable. No additional management support is needed unless otherwise documented below in the visit note. 

## 2015-03-17 NOTE — Patient Instructions (Addendum)
Contact your insurance to check on coverage for shingles vaccine.  Schedule mammogram on the first floor in imaging.  You will be contacted about scheduling your bone density.  Complete lab work prior to leaving.  Apply nystatin powder beneath both breasts daily for yeast infection.  Please schedule a visit with an eye doctor for eye check. Follow up in 6 months.

## 2015-03-17 NOTE — Assessment & Plan Note (Signed)
Stable- management per psych.  

## 2015-03-17 NOTE — Assessment & Plan Note (Signed)
Uncontrolled, trial of betamethasone- follow up with derm.

## 2015-03-17 NOTE — Assessment & Plan Note (Signed)
Stable, management per psych 

## 2015-03-17 NOTE — Assessment & Plan Note (Signed)
rx with nystatin 

## 2015-03-17 NOTE — Progress Notes (Signed)
Subjective:    Suzanne AgentShirley Reyes is a 74 y.o. female who presents for Medicare Annual/Subsequent preventive examination.  Preventive Screening-Counseling & Management  Tobacco History  Smoking status  . Never Smoker   Smokeless tobacco  . Not on file     Problems Prior to Visit 1. Schizophrenia/anxiety- maintained on daily haloperidol, [facility]   2.  Asthma- stable, declines MDI.  3.  HTN- Not on meds. BP Readings from Last 3 Encounters:  03/17/15 120/76  02/10/15 118/68  01/23/15 147/89   4. Psoriasis- followed by Dermatology.  Never picked up betamethasone- but will start.   5. Preventative-   Immunizations: due for pneumovax and prevnar and tetanus Diet: reports diet is fair.  Too much fried food.   Exercise: no  Colonoscopy: never, declines OK with cologuard.   Dexa: due   Pap Smear: s/p hysterectomy Mammogram: due    Current Problems (verified) Patient Active Problem List   Diagnosis Date Noted  . Psoriasis 02/12/2015  . Paranoid schizophrenia 02/10/2015  . Anxiety state 02/10/2015  . Asthma 02/10/2015  . HTN (hypertension) 02/10/2015    Medications Prior to Visit Current Outpatient Prescriptions on File Prior to Visit  Medication Sig Dispense Refill  . acetaminophen (TYLENOL) 500 MG tablet Take 1,500 mg by mouth every 6 (six) hours as needed. For pain    . betamethasone dipropionate (DIPROLENE) 0.05 % cream Apply topically 2 (two) times daily. Prn to affected areas 30 g 0  . diphenhydrAMINE (BENADRYL) 25 mg capsule Take 100 mg by mouth every 6 (six) hours as needed. For side affects caused by haldol    . haloperidol (HALDOL) 10 MG tablet Take 20 mg by mouth daily.     . naproxen (NAPROSYN) 500 MG tablet Take 500 mg by mouth 2 (two) times daily as needed. For pain    . Travoprost, BAK Free, (TRAVATAN) 0.004 % SOLN ophthalmic solution Place 1 drop into both eyes at bedtime.     No current facility-administered medications on  file prior to visit.    Current Medications (verified) Current Outpatient Prescriptions  Medication Sig Dispense Refill  . acetaminophen (TYLENOL) 500 MG tablet Take 1,500 mg by mouth every 6 (six) hours as needed. For pain    . betamethasone dipropionate (DIPROLENE) 0.05 % cream Apply topically 2 (two) times daily. Prn to affected areas 30 g 0  . diphenhydrAMINE (BENADRYL) 25 mg capsule Take 100 mg by mouth every 6 (six) hours as needed. For side affects caused by haldol    . haloperidol (HALDOL) 10 MG tablet Take 20 mg by mouth daily.     . naproxen (NAPROSYN) 500 MG tablet Take 500 mg by mouth 2 (two) times daily as needed. For pain    . Travoprost, BAK Free, (TRAVATAN) 0.004 % SOLN ophthalmic solution Place 1 drop into both eyes at bedtime.     No current facility-administered medications for this visit.     Allergies (verified) Lithium and Penicillins   PAST HISTORY  Family History Family History  Problem Relation Age of Onset  . Kidney disease Mother   . Asthma Father   . Heart disease Father     died at age 74 "bad heart"  . Cancer Sister     lung,smoker  . Diabetes Daughter   . Cancer Son     lung, died in his 7240's    Social History History  Substance Use Topics  . Smoking status: Never Smoker   .  Smokeless tobacco: Not on file  . Alcohol Use: No     Are there smokers in your home (other than you)? Yes- grandson  Risk Factors Current exercise habits: none  Dietary issues discussed: yes   Cardiac risk factors: advanced age (older than 6855 for men, 365 for women), hypertension, obesity (BMI >= 30 kg/m2) and sedentary lifestyle.  Depression Screen (Note: if answer to either of the following is "Yes", a more complete depression screening is indicated)   Over the past two weeks, have you felt down, depressed or hopeless? No  Over the past two weeks, have you felt little interest or pleasure in doing things? No  Have you lost interest or pleasure in daily life?  No  Do you often feel hopeless? No  Do you cry easily over simple problems? No  Activities of Daily Living In your present state of health, do you have any difficulty performing the following activities?:  Driving? Yes Managing money?  No Feeding yourself? No Getting from bed to chair? No. Climbing a flight of stairs? No Preparing food and eating?: No Bathing or showering? No Getting dressed: No Getting to the toilet? No Using the toilet:No Moving around from place to place: No In the past year have you fallen or had a near fall?:No   Are you sexually active?  No  Do you have more than one partner?  No  Hearing Difficulties: Yes Do you often ask people to speak up or repeat themselves? Yes Do you experience ringing or noises in your ears? occasionally Do you have difficulty understanding soft or whispered voices? Yes- declines referral to audiology   Do you feel that you have a problem with memory? No  Do you often misplace items? Yes- sometimes  Do you feel safe at home?  Yes  Cognitive Testing  Alert? Yes  Normal Appearance?Yes  Oriented to person? Yes  Place? Yes   Time? Yes  Recall of three objects?  Yes  Can perform simple calculations? Yes  Displays appropriate judgment?Yes  Can read the correct time from a watch face?Yes   Advanced Directives have been discussed with the patient? Yes  List the Names of Other Physician/Practitioners you currently use: 1.  Dr. Reginia NaasEdward Smith -Dermatology 2.  Psych- [facility]  Indicate any recent Medical Services you may have received from other than [facility] providers in the past year (date may be approximate).   There is no immunization history on file for this patient.  Screening Tests Health Maintenance  Topic Date Due  . TETANUS/TDAP  09/08/1960  . MAMMOGRAM  09/09/1991  . COLONOSCOPY  09/09/1991  . ZOSTAVAX  09/08/2001  . DEXA SCAN  09/08/2006  . PNA vac Low Risk Adult (1 of 2 - PCV13) 09/08/2006  . INFLUENZA VACCINE   06/08/2015    All answers were reviewed with the patient and necessary referrals were made:  Suzanne Reyes,Suzanne Boening S., NP   03/17/2015   History reviewed: allergies, current medications, past family history, past medical history, past social history, past surgical history and problem list  Review of Systems  Review of Systems  Constitutional: Negative for unexpected weight change.  HENT: Negative for and rhinorrhea.   Eyes: Negative for visual disturbance.  Respiratory: Negative for cough.   Cardiovascular: Negative for chest pain and leg swelling.  Gastrointestinal: Negative for nausea, vomiting, diarrhea and blood in stool.  Genitourinary: Negative for dysuria and frequency.  Musculoskeletal: Negative for myalgias and arthralgias.  Skin: +  rash.  Neurological: Negative for  headaches.  Hematological: Negative for adenopathy.  Psychiatric/Behavioral: Denies depression or anxiety- currently     Objective:     Body mass index is 43.65 kg/(m^2). BP 120/76 mmHg  Pulse 83  Temp(Src) 97.8 F (36.6 C) (Oral)  Resp 16  Ht 5' 1.5" (1.562 m)  Wt 234 lb 12.8 oz (106.505 kg)  BMI 43.65 kg/m2  SpO2 99%  Physical Exam  Constitutional: She is oriented to person, place, and time. She appears well-developed and well-nourished. No distress.  HENT:  Head: Normocephalic and atraumatic.  Right Ear: Tympanic membrane and ear canal normal.  Left Ear: Tympanic membrane and ear canal normal.  Mouth/Throat: Oropharynx is clear and moist.  Eyes: Pupils are equal, round, and reactive to light. No scleral icterus.  Neck: Normal range of motion. No thyromegaly present.  Cardiovascular: Normal rate and regular rhythm.   No murmur heard. Pulmonary/Chest: Effort normal and breath sounds normal. No respiratory distress. He has no wheezes. She has no rales. She exhibits no tenderness.  Abdominal: Soft. Bowel sounds are normal. He exhibits no distension and no mass. There is no tenderness. There is no  rebound and no guarding.  Musculoskeletal: She exhibits no edema.  Lymphadenopathy:    She has no cervical adenopathy.  Neurological: She is alert and oriented to person, place, and time.  She exhibits normal muscle tone. Coordination normal.  Skin: Skin is warm and dry. + diffuse psoriatic rash arms/trunk, intertrigo rash beneath both breasts.  Psychiatric: She has a normal mood and affect. Her behavior is normal. Judgment and thought content normal.  Breasts: Examined lying Right: Without masses, retractions, discharge or axillary adenopathy.  Left: Without masses, retractions, discharge or axillary adenopathy.       Assessment & Plan:        Assessment:          Plan:     During the course of the visit the patient was educated and counseled about appropriate screening and preventive services including:    Pneumococcal vaccine   Td vaccine  Screening electrocardiogram  Screening mammography  Bone densitometry screening  Colorectal cancer screening  Nutrition counseling   Advanced directives: pt given HCPOA and MOST forms  Diet review for nutrition referral? Yes ____  Not Indicated __x__   Patient Instructions (the written plan) was given to the patient.  Medicare Attestation I have personally reviewed: The patient's medical and social history Their use of alcohol, tobacco or illicit drugs Their current medications and supplements The patient's functional ability including ADLs,fall risks, home safety risks, cognitive, and hearing and visual impairment Diet and physical activities Evidence for depression or mood disorders  The patient's weight, height, BMI, and visual acuity have been recorded in the chart.  I have made referrals, counseling, and provided education to the patient based on review of the above and I have provided the patient with a written personalized care plan for preventive services.     Lemont Fillers., NP   03/17/2015       Patient ID: Suzanne Reyes, female   DOB: 17-Jan-1941, 74 y.o.   MRN: [id number]

## 2015-03-17 NOTE — Assessment & Plan Note (Signed)
Stable not requiring albuterol.

## 2015-03-17 NOTE — Assessment & Plan Note (Signed)
BP stable off of meds.  Monitor.  

## 2015-03-18 ENCOUNTER — Telehealth: Payer: Self-pay | Admitting: *Deleted

## 2015-03-18 NOTE — Telephone Encounter (Signed)
Form faxed to [employer] to start process.

## 2015-03-19 ENCOUNTER — Telehealth: Payer: Self-pay | Admitting: Family

## 2015-03-19 DIAGNOSIS — H409 Unspecified glaucoma: Secondary | ICD-10-CM

## 2015-03-19 DIAGNOSIS — I452 Bifascicular block: Secondary | ICD-10-CM

## 2015-03-19 DIAGNOSIS — N289 Disorder of kidney and ureter, unspecified: Secondary | ICD-10-CM | POA: Insufficient documentation

## 2015-03-19 DIAGNOSIS — D649 Anemia, unspecified: Secondary | ICD-10-CM

## 2015-03-19 NOTE — Telephone Encounter (Signed)
Please let pt know that her blood work shows some renal insufficiency.  I suspect this is not new because I saw this diagnosis listed in some old records of hers.  She should avoid use of NSAIDS as these can be harmful to her kidneys. In addition, there are some abnormalities on her EKG and I would like for her to meet with cardiology.

## 2015-03-20 NOTE — Telephone Encounter (Signed)
Notified patient.  Patient stated understanding and would like to proceed with referral (already placed).

## 2015-03-25 ENCOUNTER — Inpatient Hospital Stay: Admission: RE | Admit: 2015-03-25 | Payer: Medicare HMO | Source: Ambulatory Visit

## 2015-03-31 ENCOUNTER — Ambulatory Visit (HOSPITAL_BASED_OUTPATIENT_CLINIC_OR_DEPARTMENT_OTHER): Payer: Medicare HMO

## 2015-04-20 ENCOUNTER — Encounter: Payer: Self-pay | Admitting: Family

## 2015-04-20 ENCOUNTER — Telehealth: Payer: Self-pay | Admitting: *Deleted

## 2015-04-20 NOTE — Telephone Encounter (Signed)
Cologuard kit delivered to pt on 03/23/15 per pt portal. Kit not returned yet. Will need to check with pt re: status.

## 2015-04-20 NOTE — Telephone Encounter (Signed)
Spoke with pt. She is currently out of town with family and expects to return home around Friday. Pt thinks she will be able to complete cologuard the new week.

## 2015-05-19 NOTE — Progress Notes (Signed)
     HPI: 74 yo female for evaluation of abnormal ECG. Patient has some fatigue with exertion but denies orthopnea, PND, pedal edema, exertional chest pain, presyncope or syncope. She was noted to have a bifascicular block on electrocardiogram and cardiology asked to evaluate.  Current Outpatient Prescriptions  Medication Sig Dispense Refill  . acetaminophen (TYLENOL) 500 MG tablet Take 1,500 mg by mouth every 6 (six) hours as needed. For pain    . betamethasone dipropionate (DIPROLENE) 0.05 % cream Apply topically 2 (two) times daily. Prn to affected areas 30 g 0  . diphenhydrAMINE (BENADRYL) 25 mg capsule Take 100 mg by mouth every 6 (six) hours as needed. For side affects caused by haldol    . haloperidol (HALDOL) 10 MG tablet Take 20 mg by mouth daily.     . hydrocortisone cream 1 % Apply 1 application topically as needed for itching.    . nystatin (MYCOSTATIN/NYSTOP) 100000 UNIT/GM POWD Apply daily beneath breasts 30 g 1   No current facility-administered medications for this visit.    Allergies  Allergen Reactions  . Lithium Other (See Comments)    Stomach cramps   . Penicillins Other (See Comments)    Tooth pain     Past Medical History  Diagnosis Date  . Paranoid schizophrenia   . Psoriasis   . Anxiety   . Hypertension   . Asthma     Past Surgical History  Procedure Laterality Date  . Abdominal hysterectomy    . Hernia repair      History   Social History  . Marital Status: Widowed    Spouse Name: N/A  . Number of Children: 6  . Years of Education: N/A   Occupational History  . Not on file.   Social History Main Topics  . Smoking status: Never Smoker   . Smokeless tobacco: Not on file  . Alcohol Use: No  . Drug Use: No  . Sexual Activity: Not on file   Other Topics Concern  . Not on file   Social History Narrative   Lives with grandson who is 52 years old   [place]rs ago   Retired Agricultural engineernursing assistant   Had 7 children   Enjoys  cooking    Family History  Problem Relation Age of Onset  . Kidney disease Mother   . Asthma Father   . Heart disease Father     died at age 74 "bad heart"  . Cancer Sister     lung,smoker  . Diabetes Daughter   . Cancer Son     lung, died in his 3840's    ROS: hip arthralgias but no fevers or chills, productive cough, hemoptysis, dysphasia, odynophagia, melena, hematochezia, dysuria, hematuria, rash, seizure activity, orthopnea, PND, pedal edema, claudication. Remaining systems are negative.  Physical Exam:   Blood pressure 124/76, pulse 87, height 5' 1.5" (1.562 m), weight 233 lb (105.688 kg).  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

## 2015-05-20 ENCOUNTER — Ambulatory Visit (INDEPENDENT_AMBULATORY_CARE_PROVIDER_SITE_OTHER): Payer: Medicare HMO | Admitting: Cardiology

## 2015-05-20 ENCOUNTER — Encounter: Payer: Self-pay | Admitting: Cardiology

## 2015-05-20 VITALS — BP 124/76 | HR 87 | Ht 61.5 in | Wt 233.0 lb

## 2015-05-20 DIAGNOSIS — R9431 Abnormal electrocardiogram [ECG] [EKG]: Secondary | ICD-10-CM | POA: Diagnosis not present

## 2015-05-20 NOTE — Assessment & Plan Note (Signed)
Blood pressure controlled on no medications. 

## 2015-05-20 NOTE — Patient Instructions (Signed)
Medication Instructions:  - no change  Labwork: - none  Testing/Procedures: Your physician has requested that you have an echocardiogram. Echocardiography is a painless test that uses sound waves to create images of your heart. It provides your doctor with information about the size and shape of your heart and how well your heart's chambers and valves are working. This procedure takes approximately one hour. There are no restrictions for this procedure.     Follow-Up: - see what test shows  Any Other Special Instructions Will Be Listed Below (If Applicable).

## 2015-05-20 NOTE — Assessment & Plan Note (Addendum)
Patient is noted to have a bifascicular block on her electrocardiogram. This is likely secondary to conduction disease with aging. However she is not having significant symptoms including no chest pain or syncope. She has some fatigue with exertion. We plan an echocardiogram to assess LV function. If normal I do not think she requires further cardiac evaluation unless she has bradycardia or syncopal episodes in the future.

## 2015-05-27 ENCOUNTER — Ambulatory Visit (HOSPITAL_BASED_OUTPATIENT_CLINIC_OR_DEPARTMENT_OTHER): Payer: Medicare HMO

## 2015-09-18 ENCOUNTER — Ambulatory Visit: Payer: Medicare HMO | Admitting: Family

## 2015-09-22 ENCOUNTER — Ambulatory Visit: Payer: Medicare HMO | Admitting: Family

## 2015-09-29 ENCOUNTER — Ambulatory Visit: Payer: Medicare HMO | Admitting: Family

## 2015-09-29 DIAGNOSIS — Z0289 Encounter for other administrative examinations: Secondary | ICD-10-CM

## 2015-10-07 ENCOUNTER — Telehealth: Payer: Self-pay | Admitting: Family

## 2015-10-07 NOTE — Telephone Encounter (Signed)
Pt was no show 09/29/15 11:00am for follow up, pt is rescheduled for 10/21/15, charge or no charge?

## 2015-10-07 NOTE — Telephone Encounter (Signed)
Yes please

## 2015-10-21 ENCOUNTER — Ambulatory Visit: Payer: Medicare HMO | Admitting: Family

## 2015-10-21 ENCOUNTER — Telehealth: Payer: Self-pay | Admitting: Family

## 2015-10-26 NOTE — Telephone Encounter (Signed)
Yes please

## 2015-10-26 NOTE — Telephone Encounter (Signed)
Pt no show 10/21/15 10:30am for follow up (2nd no show), pt has not rescheduled, charge or no charge?

## 2016-05-19 ENCOUNTER — Telehealth: Payer: Self-pay | Admitting: Family

## 2016-05-19 NOTE — Telephone Encounter (Signed)
I see she never completed mammogram that I ordered last year for her.  Please contact pt and encourage her to re-schedule. Thanks,  [employer]issa

## 2016-05-19 NOTE — Telephone Encounter (Signed)
Called patient left message for return call.

## 2016-05-23 ENCOUNTER — Other Ambulatory Visit: Payer: Self-pay | Admitting: Family

## 2016-05-23 NOTE — Telephone Encounter (Signed)
Called [facility] and they state last mammogram they did was in 2008. Also checked with [facility] [place] and they state they never did mammogram on pt before. Spoke with pt and transferred her to radiology downstairs to schedule mammogram.

## 2017-07-11 ENCOUNTER — Encounter: Payer: Self-pay | Admitting: Family

## 2017-07-11 ENCOUNTER — Ambulatory Visit (INDEPENDENT_AMBULATORY_CARE_PROVIDER_SITE_OTHER): Payer: Medicare HMO | Admitting: Family

## 2017-07-11 ENCOUNTER — Ambulatory Visit (HOSPITAL_BASED_OUTPATIENT_CLINIC_OR_DEPARTMENT_OTHER)
Admission: RE | Admit: 2017-07-11 | Discharge: 2017-07-11 | Disposition: A | Discharge status: Home / Self Care | Payer: Medicare HMO | Source: Ambulatory Visit | Attending: Family | Admitting: Family

## 2017-07-11 ENCOUNTER — Telehealth: Payer: Self-pay | Admitting: Family

## 2017-07-11 VITALS — BP 120/70 | HR 83 | Temp 98.2°F | Resp 16 | Ht 61.5 in | Wt 221.2 lb

## 2017-07-11 DIAGNOSIS — M545 Low back pain: Secondary | ICD-10-CM

## 2017-07-11 DIAGNOSIS — M25551 Pain in right hip: Secondary | ICD-10-CM

## 2017-07-11 DIAGNOSIS — F200 Paranoid schizophrenia: Secondary | ICD-10-CM

## 2017-07-11 DIAGNOSIS — M160 Bilateral primary osteoarthritis of hip: Secondary | ICD-10-CM | POA: Insufficient documentation

## 2017-07-11 DIAGNOSIS — M5440 Lumbago with sciatica, unspecified side: Secondary | ICD-10-CM | POA: Diagnosis present

## 2017-07-11 DIAGNOSIS — F419 Anxiety disorder, unspecified: Secondary | ICD-10-CM | POA: Diagnosis not present

## 2017-07-11 DIAGNOSIS — M25552 Pain in left hip: Secondary | ICD-10-CM | POA: Insufficient documentation

## 2017-07-11 DIAGNOSIS — M169 Osteoarthritis of hip, unspecified: Secondary | ICD-10-CM

## 2017-07-11 DIAGNOSIS — K5792 Diverticulitis of intestine, part unspecified, without perforation or abscess without bleeding: Secondary | ICD-10-CM | POA: Diagnosis not present

## 2017-07-11 DIAGNOSIS — E559 Vitamin D deficiency, unspecified: Secondary | ICD-10-CM | POA: Diagnosis not present

## 2017-07-11 NOTE — Telephone Encounter (Signed)
Please contact pt and let her know that I wrote her letter for her apartment. Also, could you please ask her who wrote the rx for ceftin for her and what it was prescribed for?

## 2017-07-11 NOTE — Telephone Encounter (Signed)
Also, please let pt know back looks good. Both hips show advanced arthritis. I would like her to see ortho- referral placed.

## 2017-07-11 NOTE — Telephone Encounter (Signed)
Spoke with pt regarding letter and she requests that it be mailed to her. Letter mailed. Did not see recommendation below re: referral and xray results. Attempted to call pt back and left message to return my call tomorrow.

## 2017-07-11 NOTE — Patient Instructions (Signed)
Please complete x-rays on the first floor.  We will contact you with your results.  

## 2017-07-11 NOTE — Progress Notes (Signed)
Subjective:    Patient ID: Suzanne Reyes, female    DOB: 1941-02-02, 76 y.o.   MRN: [id number]  HPI  Suzanne Reyes is a 76 yr old female who presents today for follow up. I last saw her 03/17/15.    CKD stage IV- this is being followed by Dr. Lequita Halt. Last Cr ws 2.26 back in the end of July. She is on a potassium and fluid restricted diet.   Vit D deficiency- noted at her most recent visit to nephrology (vit D level was 9).   HTN-  BP Readings from Last 3 Encounters:  07/11/17 120/70  05/20/15 124/76  03/17/15 120/76   Paranoid schizophrenia/anxiety-She has followed with [facility]- continues to follow there. Reports mood and medications have been stable.   Abdominal pain- was seen in the ED on 8/27. Reviewed note in care everywhere. CT noted possible mild diverticulitis.  She was sent home on levaquin and metronidazole. Reports no abdominal pain.  Reports that her appetite has been poor for the last week or two.    Leg pain- reports bilateral LE pain x 2 months. Reports some low back pain. Pain radiates down the front of both legs.    Review of Systems    see HPI  Past Medical History:  Diagnosis Date  . Anxiety   . Asthma   . Hypertension   . Paranoid schizophrenia (HCC)   . Psoriasis      Social History   Social History  . Marital status: Widowed    Spouse name: N/A  . Number of children: 6  . Years of education: N/A   Occupational History  . Not on file.   Social History Main Topics  . Smoking status: Never Smoker  . Smokeless tobacco: Never Used  . Alcohol use No  . Drug use: No  . Sexual activity: Not on file   Other Topics Concern  . Not on file   Social History Narrative   Lives with grandson who is 39 years old   Moved from [place] 40 yrs ago   Retired Agricultural engineer   Had 7 children   Enjoys cooking    Past Surgical History:  Procedure Laterality Date  . ABDOMINAL HYSTERECTOMY    . HERNIA REPAIR      Family History  Problem Relation Age  of Onset  . Kidney disease Mother   . Asthma Father   . Heart disease Father        died at age 63 "bad heart"  . Cancer Sister        lung,smoker  . Diabetes Daughter   . Cancer Son        lung, died in his 78's    Allergies  Allergen Reactions  . Lithium Other (See Comments)    Stomach cramps   . Penicillins Other (See Comments)    Tooth pain  . Temazepam Other (See Comments)    Body aches    Current Outpatient Prescriptions on File Prior to Visit  Medication Sig Dispense Refill  . diphenhydrAMINE (BENADRYL) 25 mg capsule Take 100 mg by mouth every 6 (six) hours as needed. For side affects caused by haldol     No current facility-administered medications on file prior to visit.     BP 120/70 (BP Location: Right Arm, Cuff Size: Large)   Pulse 83   Temp 98.2 F (36.8 C) (Oral)   Resp 16   Ht 5' 1.5" (1.562 m)   Wt 221 lb  3.2 oz (100.3 kg)   SpO2 94%   BMI 41.12 kg/m    Objective:   Physical Exam  Constitutional: She appears well-developed and well-nourished.  Cardiovascular: Normal rate, regular rhythm and normal heart sounds.   No murmur heard. Pulmonary/Chest: Effort normal and breath sounds normal. No respiratory distress. She has no wheezes.  Abdominal: Soft. She exhibits no distension. There is no tenderness. There is no rebound and no guarding.  Musculoskeletal:       Cervical back: She exhibits no tenderness.       Thoracic back: She exhibits no tenderness.       Lumbar back: She exhibits no tenderness.  2+ bilateral LE edema  Neurological: She is alert.  Skin: Skin is warm and dry.  Psychiatric: She has a normal mood and affect. Her behavior is normal. Judgment and thought content normal.          Assessment & Plan:  Bilateral hip pain- check x-rays of both hips.  Suspect osteoarthritis of both hips. We did discuss adding standing tylenol bid for pain relief.  Advised her to avoid NSAIDS due to   Low back pain with radiculopathy- will start  with an xray of the lumbar spine.  Diverticulitis- clinically resolved.   HTN- BP is stable without BP medication.  Paranoid schizophrenia/anxiety- stable, management per psychiatry.   Vit D deficiency- on vit D supplement which is being managed by psychiatry.

## 2017-07-18 NOTE — Telephone Encounter (Signed)
Notified pt of below. She has completed Ceftin. It was prescribed by hospital for an "intestinal infection". She is agreeable to proceed with orthopedic referral and will let us know if she has not been contacted about appt within 1 week.

## 2017-07-28 ENCOUNTER — Telehealth: Payer: Self-pay | Admitting: *Deleted

## 2017-07-28 NOTE — Telephone Encounter (Signed)
Received forms for Special Housing Need & Disability from Pt's housing [facility] [address]], will complete as much as possible and then forward to provider/SLS 09/21

## 2017-07-31 NOTE — Telephone Encounter (Signed)
Forms forwarded to provider with file copy of 07/11/17 letter/SLS 09/24

## 2017-08-10 ENCOUNTER — Ambulatory Visit (INDEPENDENT_AMBULATORY_CARE_PROVIDER_SITE_OTHER): Payer: Self-pay | Admitting: Orthopedic Surgery

## 2017-08-14 NOTE — Telephone Encounter (Signed)
Faxed on 08/07/17 with confirmation/SLS

## 2017-08-17 ENCOUNTER — Ambulatory Visit (INDEPENDENT_AMBULATORY_CARE_PROVIDER_SITE_OTHER): Payer: Self-pay | Admitting: Orthopedic Surgery

## 2017-09-11 ENCOUNTER — Ambulatory Visit (INDEPENDENT_AMBULATORY_CARE_PROVIDER_SITE_OTHER): Payer: Medicare HMO | Admitting: Orthopedic Surgery

## 2017-09-11 ENCOUNTER — Encounter (INDEPENDENT_AMBULATORY_CARE_PROVIDER_SITE_OTHER): Payer: Self-pay | Admitting: Orthopedic Surgery

## 2017-09-11 DIAGNOSIS — M160 Bilateral primary osteoarthritis of hip: Secondary | ICD-10-CM

## 2017-09-12 ENCOUNTER — Encounter (INDEPENDENT_AMBULATORY_CARE_PROVIDER_SITE_OTHER): Payer: Self-pay | Admitting: Orthopedic Surgery

## 2017-09-12 NOTE — Progress Notes (Signed)
Office Visit Note   Patient: Suzanne Reyes           Date of Birth: 12/03/1940           MRN: [id number] Visit Date: 09/11/2017 Requested by: Sandford Craze'Sullivan, Melissa, NP [address] [place], [place] [id number] PCP: Sandford Craze'Sullivan, Melissa, NP  Subjective: Chief Complaint  Patient presents with  . Hip Pain    bilateral hip pain right worse than left    HPI: Suzanne ForestShirley is a 89 the 89-year-old patient with bilateral hip pain right worse than left.  Pain only occurs when she is walking.  States that she will occasionally wake from sleep with pain.  She and malaise with a cane.  Tylenol helps.  Right hurts worse than left.  She lives with her daughter.  She takes supplemental vitamin D.  She states that she can walk "pretty 4".  Uses Tylenol for pain.              ROS: All systems reviewed are negative as they relate to the chief complaint within the history of present illness.  Patient denies  fevers or chills.   Assessment & Plan: Visit Diagnoses:  1. Bilateral primary osteoarthritis of hip     Plan: Impression is bilateral symptomatic hip arthritis.  Plan is to have her go to Dr. Alvester MorinNewton for right hip injection.  I also like him to do the left hip.  We'll see how these injections help.  She may be a candidate for total hip replacement in the future.  For now she wants to see what she can do with nonoperative therapy.  Follow-up with me as needed  Follow-Up Instructions: No Follow-up on file.   Orders:  Orders Placed This Encounter  Procedures  . Ambulatory referral to Physical Medicine Rehab   No orders of the defined types were placed in this encounter.     Procedures: No procedures performed   Clinical Data: No additional findings.  Objective: Vital Signs: There were no vitals taken for this visit.  Physical Exam:   Constitutional: Patient appears well-developed HEENT:  Head: Normocephalic Eyes:EOM are normal Neck: Normal range of motion Cardiovascular:  Normal rate Pulmonary/chest: Effort normal Neurologic: Patient is alert Skin: Skin is warm Psychiatric: Patient has normal mood and affect    Ortho Exam: Orthopedic exam demonstrates restricted internal rotation bilateral hips with pain.  No nerve retention signs.  Palpable pedal pulses.  Good ankle dorsi flexion plantar flexion quite hamstring strength.  No pain with forward bending.  She does walk with a cane.  Leg lengths approximately equal.  No paresthesias L1-S1 bilaterally  Specialty Comments:  No specialty comments available.  Imaging: No results found.   PMFS History: Patient Active Problem List   Diagnosis Date Noted  . Abnormal electrocardiogram 05/20/2015  . Renal insufficiency 03/19/2015  . Anemia 03/19/2015  . Glaucoma 03/19/2015  . Intertrigo 03/17/2015  . Psoriasis 02/12/2015  . Paranoid schizophrenia (HCC) 02/10/2015  . Anxiety state 02/10/2015  . Asthma 02/10/2015  . HTN (hypertension) 02/10/2015   Past Medical History:  Diagnosis Date  . Anxiety   . Asthma   . Hypertension   . Paranoid schizophrenia (HCC)   . Psoriasis     Family History  Problem Relation Age of Onset  . Kidney disease Mother   . Asthma Father   . Heart disease Father        died at age 76 "bad heart"  . Cancer Sister  lung,smoker  . Diabetes Daughter   . Cancer Son        lung, died in his 2340's    Past Surgical History:  Procedure Laterality Date  . ABDOMINAL HYSTERECTOMY    . HERNIA REPAIR     Social History   Occupational History  . Not on file  Tobacco Use  . Smoking status: Never Smoker  . Smokeless tobacco: Never Used  Substance and Sexual Activity  . Alcohol use: No  . Drug use: No  . Sexual activity: Not on file

## 2017-09-14 ENCOUNTER — Telehealth (INDEPENDENT_AMBULATORY_CARE_PROVIDER_SITE_OTHER): Payer: Self-pay | Admitting: Physical Medicine and Rehabilitation

## 2017-09-14 NOTE — Telephone Encounter (Signed)
Patient states she was referred from Dr. August Saucerean for cortisone shots in her hip. She has not received a call but I insured patient you would be calling her to schedule. Patient has kidney problems and is on multiple diets and she wasn't sure if this would affect anything with the shot.   Also, patients number is [phone] and she does not want a message left because she can not hear answering machine so please keep trying until patient is reached. Thanks!

## 2017-09-15 NOTE — Telephone Encounter (Signed)
Patient scheduled for 11/20 at 1445.

## 2017-09-26 ENCOUNTER — Ambulatory Visit (INDEPENDENT_AMBULATORY_CARE_PROVIDER_SITE_OTHER): Payer: Medicare HMO | Admitting: Physical Medicine and Rehabilitation

## 2017-12-05 ENCOUNTER — Telehealth: Payer: Self-pay | Admitting: *Deleted

## 2017-12-05 NOTE — Telephone Encounter (Signed)
Received Home Health Certification and Plan of Care; forwarded to provider/SLS 01/29  

## 2017-12-06 ENCOUNTER — Telehealth: Payer: Self-pay | Admitting: *Deleted

## 2017-12-06 NOTE — Telephone Encounter (Signed)
Received Home Health Certification and Plan of Care; forwarded to provider/SLS 01/30

## 2017-12-13 NOTE — Telephone Encounter (Signed)
Plan of Care faxed to [facility] at [phone]. Form sent for scanning.

## 2019-03-26 IMAGING — DX DG LUMBAR SPINE COMPLETE 4+V
5 series · 5 of 5 positions shown · non-contrast
Comparison: CT 07/04/2017

CLINICAL DATA: Low back pain, bilateral hip pain.

EXAM:
LUMBAR SPINE - COMPLETE 4+ VIEW

[l-spine ap]
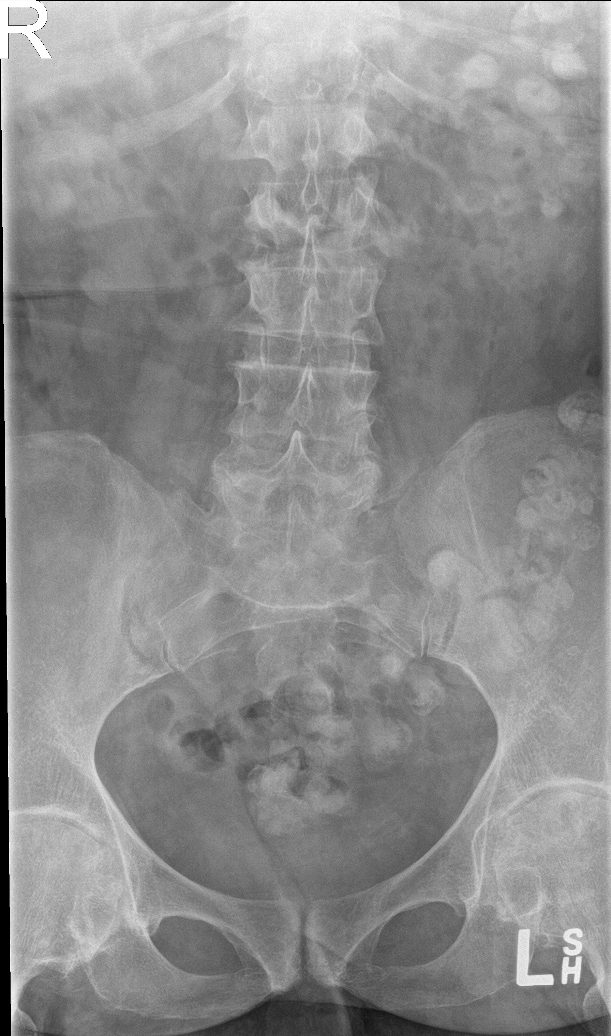

[l-spine obl (1 of 2)]
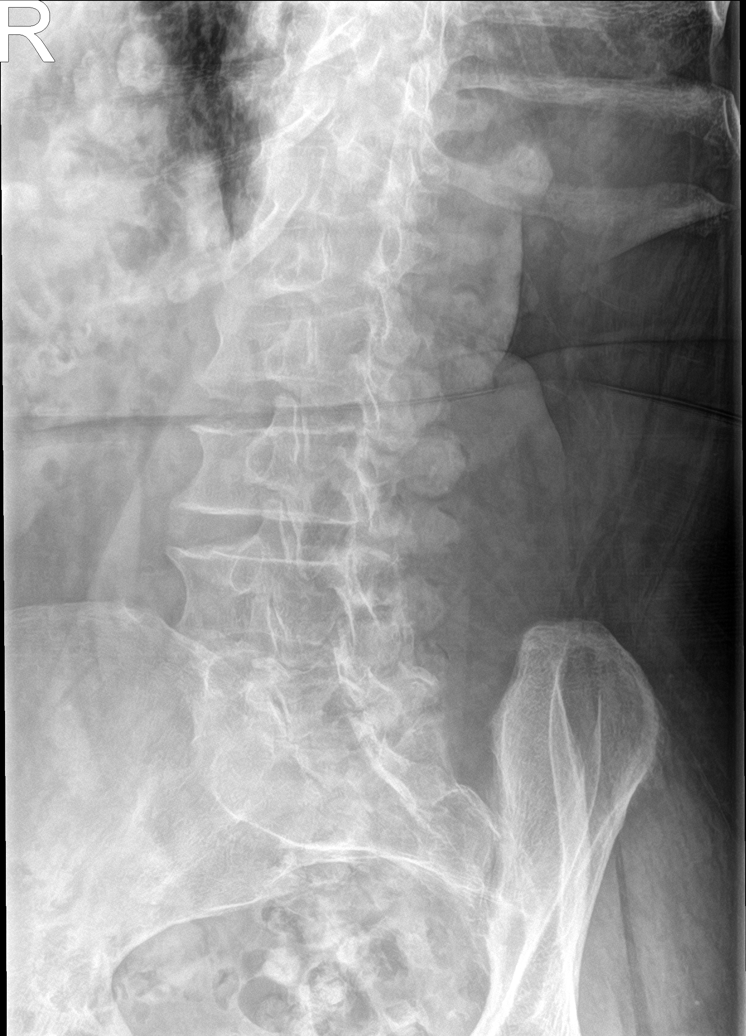

[l-spine obl (2 of 2)]
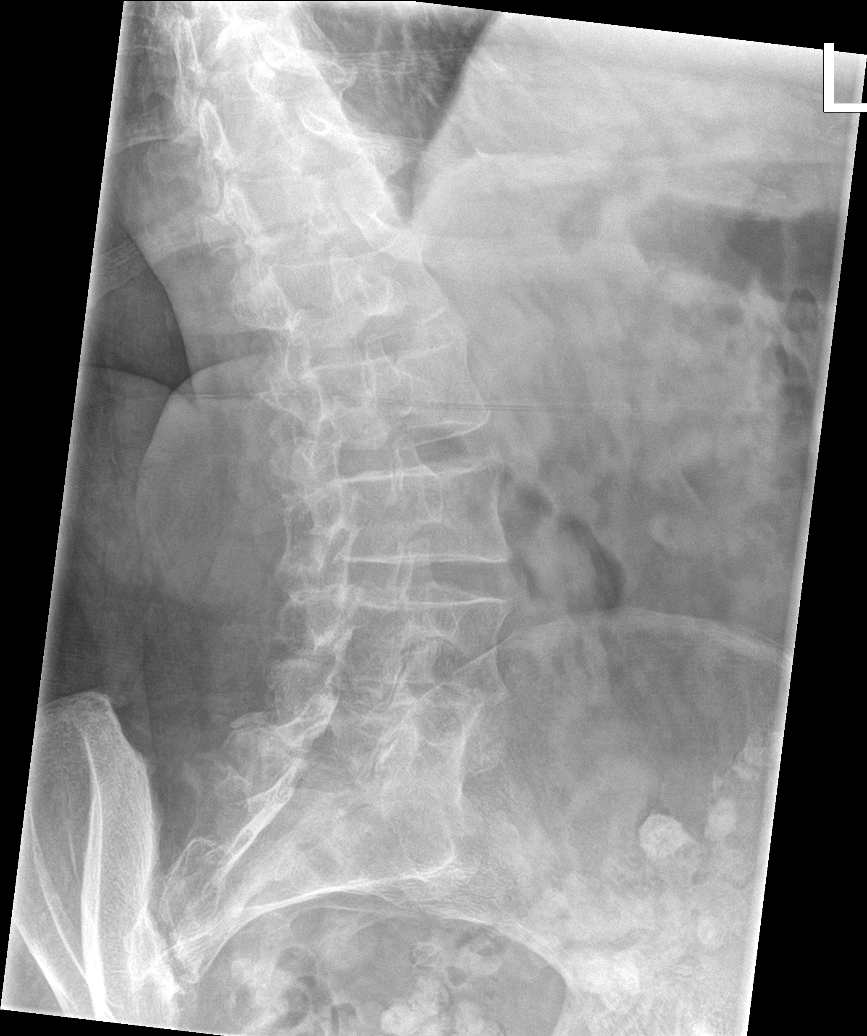

[l-spine lat]
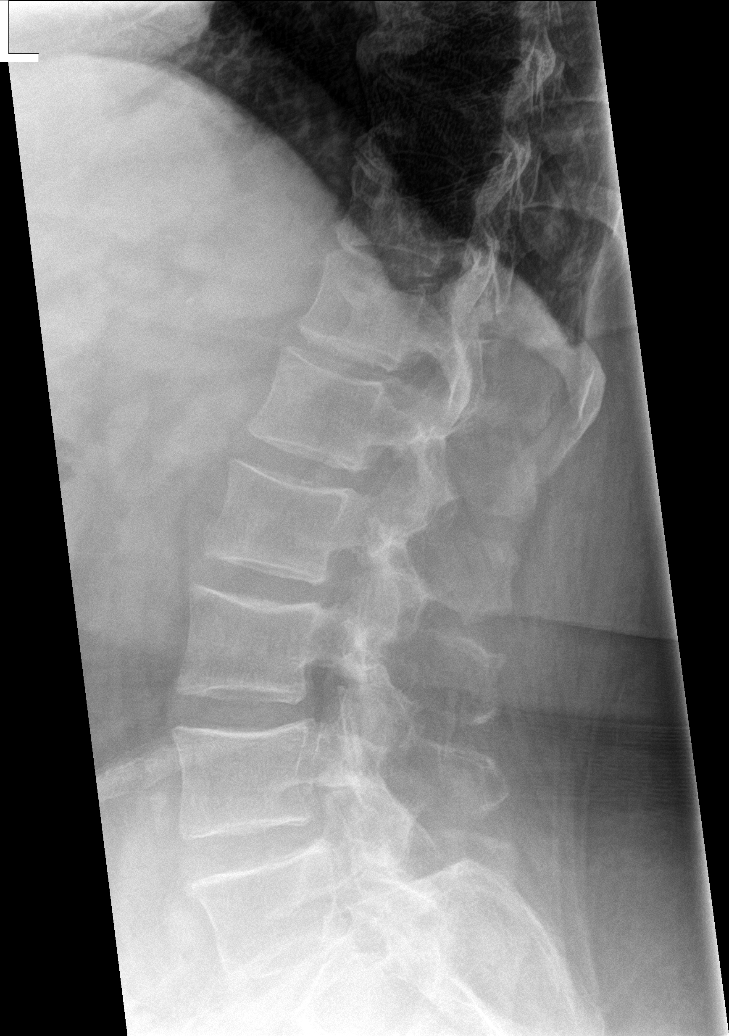

[l-spine spot]
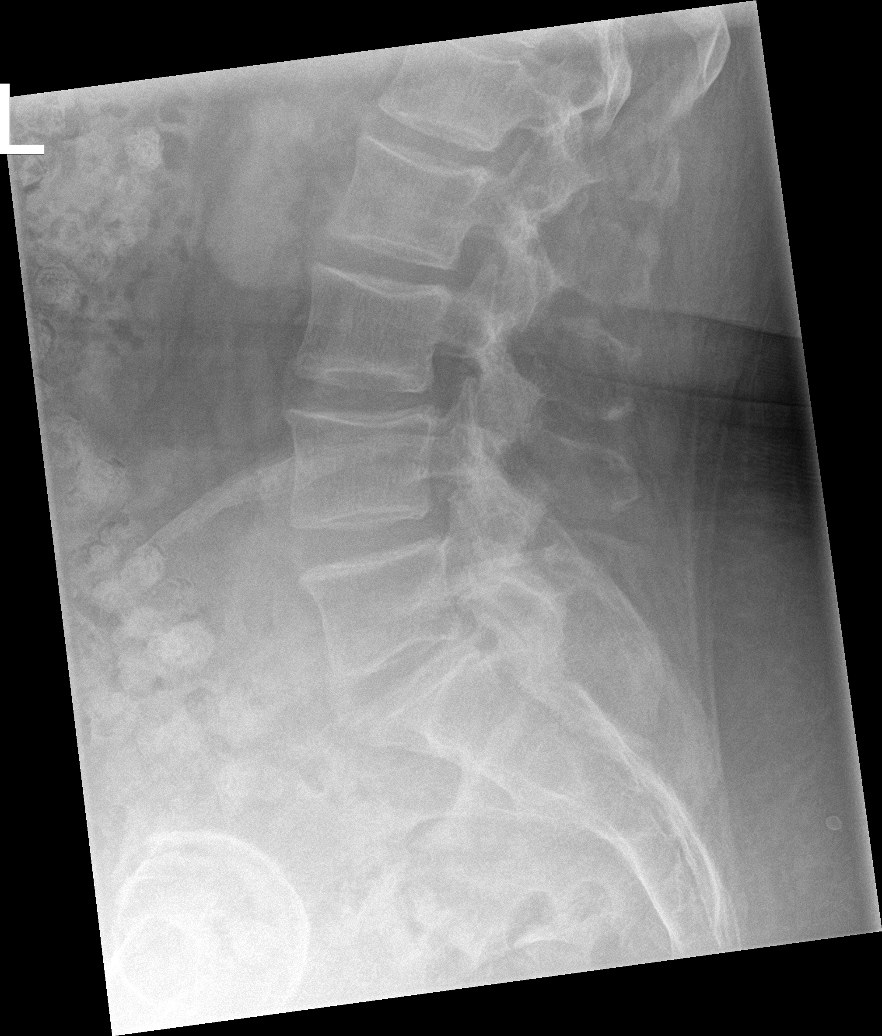

[5 of 5 positions shown; findings below may reference images not displayed]

FINDINGS: Normal alignment. No fracture. Disc spaces are maintained.
Degenerative facet disease throughout the lumbar spine.
IMPRESSION: No acute bony abnormality.  Degenerative facet disease.

## 2021-12-08 DEATH — deceased
